# Patient Record
Sex: Female | Born: 1985 | Race: Black or African American | Marital: Single | Smoking: Never smoker
Health system: Southern US, Community
[De-identification: ages and names within clinical notes are randomized; demographics above are authoritative.]

## PROBLEM LIST (undated history)

## (undated) DIAGNOSIS — I10 Essential (primary) hypertension: Secondary | ICD-10-CM

## (undated) DIAGNOSIS — N63 Unspecified lump in unspecified breast: Secondary | ICD-10-CM

## (undated) DIAGNOSIS — D649 Anemia, unspecified: Secondary | ICD-10-CM

## (undated) DIAGNOSIS — Z8619 Personal history of other infectious and parasitic diseases: Secondary | ICD-10-CM

## (undated) HISTORY — DX: Personal history of other infectious and parasitic diseases: Z86.19

## (undated) HISTORY — PX: TONSILLECTOMY: SUR1361

## (undated) HISTORY — DX: Unspecified lump in unspecified breast: N63.0

## (undated) HISTORY — DX: Essential (primary) hypertension: I10

## (undated) HISTORY — DX: Anemia, unspecified: D64.9

---

## 2014-04-11 ENCOUNTER — Encounter: Payer: Self-pay | Admitting: Family Medicine

## 2014-04-11 ENCOUNTER — Ambulatory Visit (HOSPITAL_BASED_OUTPATIENT_CLINIC_OR_DEPARTMENT_OTHER)
Admission: RE | Admit: 2014-04-11 | Discharge: 2014-04-11 | Disposition: A | Discharge status: Home / Self Care | Payer: Managed Care, Other (non HMO) | Source: Ambulatory Visit | Attending: Family Medicine | Admitting: Family Medicine

## 2014-04-11 ENCOUNTER — Encounter (INDEPENDENT_AMBULATORY_CARE_PROVIDER_SITE_OTHER): Payer: Self-pay

## 2014-04-11 ENCOUNTER — Ambulatory Visit (INDEPENDENT_AMBULATORY_CARE_PROVIDER_SITE_OTHER): Payer: Managed Care, Other (non HMO) | Admitting: Family Medicine

## 2014-04-11 VITALS — BP 140/96 | HR 88 | Ht 66.0 in | Wt 130.0 lb

## 2014-04-11 DIAGNOSIS — M542 Cervicalgia: Secondary | ICD-10-CM

## 2014-04-11 DIAGNOSIS — M5010 Cervical disc disorder with radiculopathy, unspecified cervical region: Secondary | ICD-10-CM

## 2014-04-11 DIAGNOSIS — M5412 Radiculopathy, cervical region: Secondary | ICD-10-CM

## 2014-04-11 MED ORDER — PREDNISONE (PAK) 10 MG PO TABS: ORAL_TABLET | ORAL | Status: AC

## 2014-04-11 NOTE — Patient Instructions (Signed)
Get x-rays before you leave today. You have cervical radiculopathy (a pinched nerve in the neck). Take prednisone 6 day dose pack to relieve irritation/inflammation of the nerve. You can take ibuprofen or aleve the day after you finish the prednisone. Consider cervical collar if severely painful. Simple range of motion exercises within limits of pain to prevent further stiffness. Start physical therapy for stretching, exercises, traction, and modalities. Heat 15 minutes at a time 3-4 times a day to help with spasms. Watch head position when on computers, texting, when sleeping in bed - should in line with back to prevent further nerve traction and irritation. Consider home traction unit if you get benefit with this in physical therapy. If not improving we will consider an MRI. Follow up with me in 5-6 weeks.

## 2014-04-17 ENCOUNTER — Encounter: Payer: Self-pay | Admitting: Family Medicine

## 2014-04-17 DIAGNOSIS — M542 Cervicalgia: Secondary | ICD-10-CM | POA: Insufficient documentation

## 2014-04-17 NOTE — Progress Notes (Signed)
Patient ID: Paula Mendez, female   DOB: 1986/03/01, 28 y.o.   MRN: [id number]  PCP: No primary provider on file.  Subjective:   HPI: Patient is a 28 y.o. female here for back pain.  Patient reports she's had back pain for about 12 years. 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 Was very competitive, did tumbling also. Developed pain in neck area to between scapulae more on right side than left. Went to a chiropractor 9 years ago, had x-rays too - only mild benefit. Pain has worsened recently - can't sleep at night. Tried ibuprofen, physical therapy for a few visits and a TENS unit, icing. Pain can go with numbness/tingling into right fingertips. No bowel/bladder dysfunction.  History reviewed. No pertinent past medical history.  No current outpatient prescriptions on file prior to visit.   No current facility-administered medications on file prior to visit.    Past Surgical History  Procedure Laterality Date  . Tonsillectomy      Allergies  Allergen Reactions  . Amoxicillin   . Penicillins   . Septra [Sulfamethoxazole-Trimethoprim]   . Sulfa Antibiotics     History   Social History  . Marital Status: Single    Spouse Name: N/A    Number of Children: N/A  . Years of Education: N/A   Occupational History  . Not on file.   Social History Main Topics  . Smoking status: Never Smoker   . Smokeless tobacco: Not on file  . Alcohol Use: Not on file  . Drug Use: Not on file  . Sexual Activity: Not on file   Other Topics Concern  . Not on file   Social History Narrative  . No narrative on file    No family history on file.  BP 140/96  Pulse 88  Ht 5\' 6"  (1.676 m)  Wt 130 lb (58.968 kg)  BMI 20.99 kg/m2  LMP 03/28/2014  Review of Systems: See HPI above.    Objective:  Physical Exam:  Gen: NAD  Neck: No gross deformity, swelling, bruising. TTP right cervical paraspinal region.  No midline/bony TTP. FROM neck - pain with right and  left lateral rotations. BUE strength 5/5.   Sensation intact to light touch currently. 2+ equal reflexes in triceps, biceps, brachioradialis tendons. Negative spurlings. NV intact distal BUEs.    Assessment & Plan:  1. Neck pain - concerning for cervical radiculopathy, likely discogenic.  Radiographs normal without evidence arthritis.  Will start with prednisone dose pack then transition to nsaids.  ROM exercises, more extensive therapy (only did a few visits).  Ergonomic issues discussed.  Heat as needed.  F/u in 5-6 weeks.  Consider MRI if not improving.

## 2014-04-17 NOTE — Assessment & Plan Note (Signed)
concerning for cervical radiculopathy, likely discogenic.  Radiographs normal without evidence arthritis.  Will start with prednisone dose pack then transition to nsaids.  ROM exercises, more extensive therapy (only did a few visits).  Ergonomic issues discussed.  Heat as needed.  F/u in 5-6 weeks.  Consider MRI if not improving.

## 2015-11-22 IMAGING — CR DG CERVICAL SPINE COMPLETE 4+V
5 series · 5 of 5 positions shown · non-contrast
Comparison: None.

CLINICAL DATA: Chronic neck pain

EXAM:
CERVICAL SPINE  4+ VIEWS

[w c-spine lat]
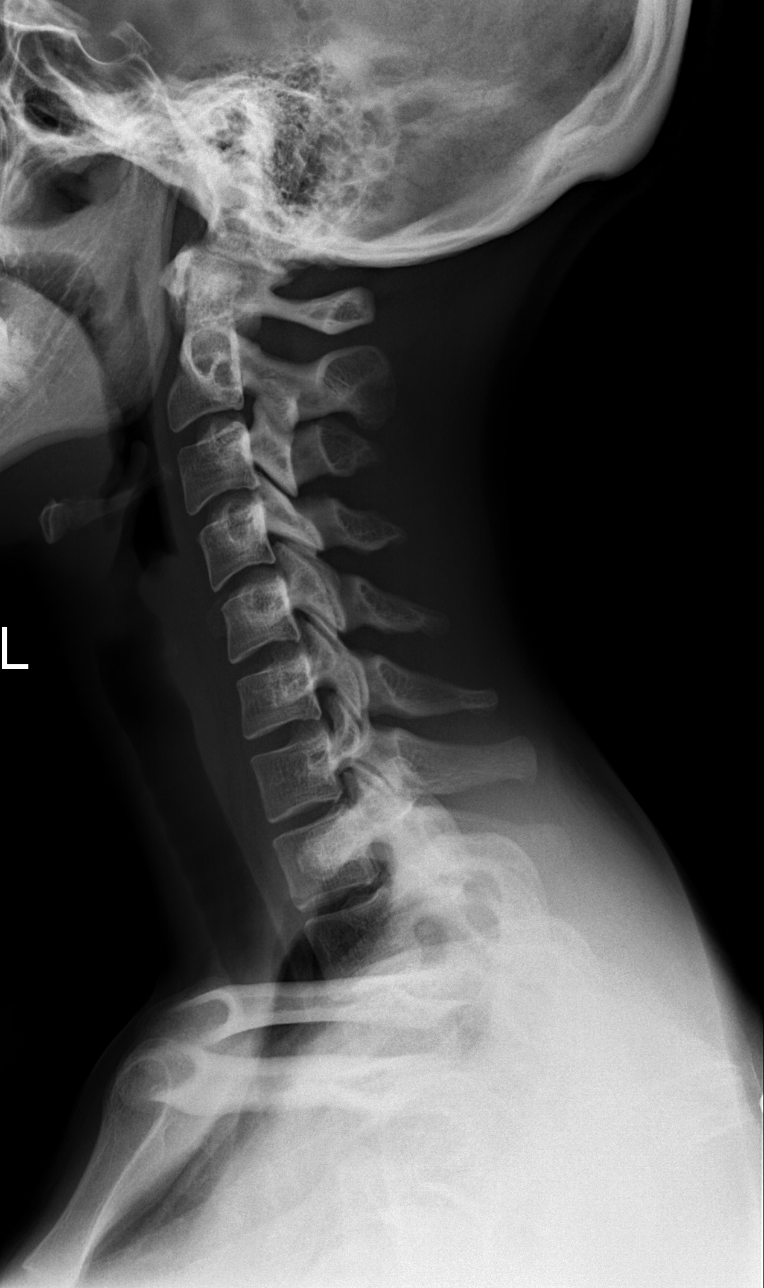

[w c-spine oblique (1 of 2)]
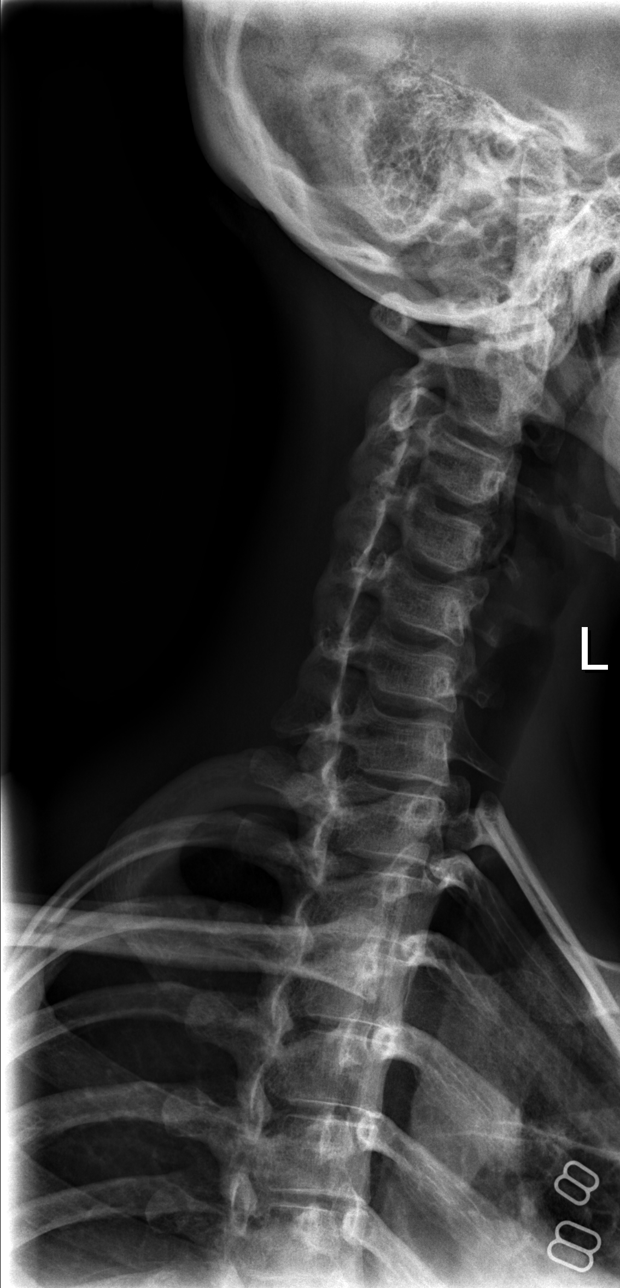

[w c-spine oblique (2 of 2)]
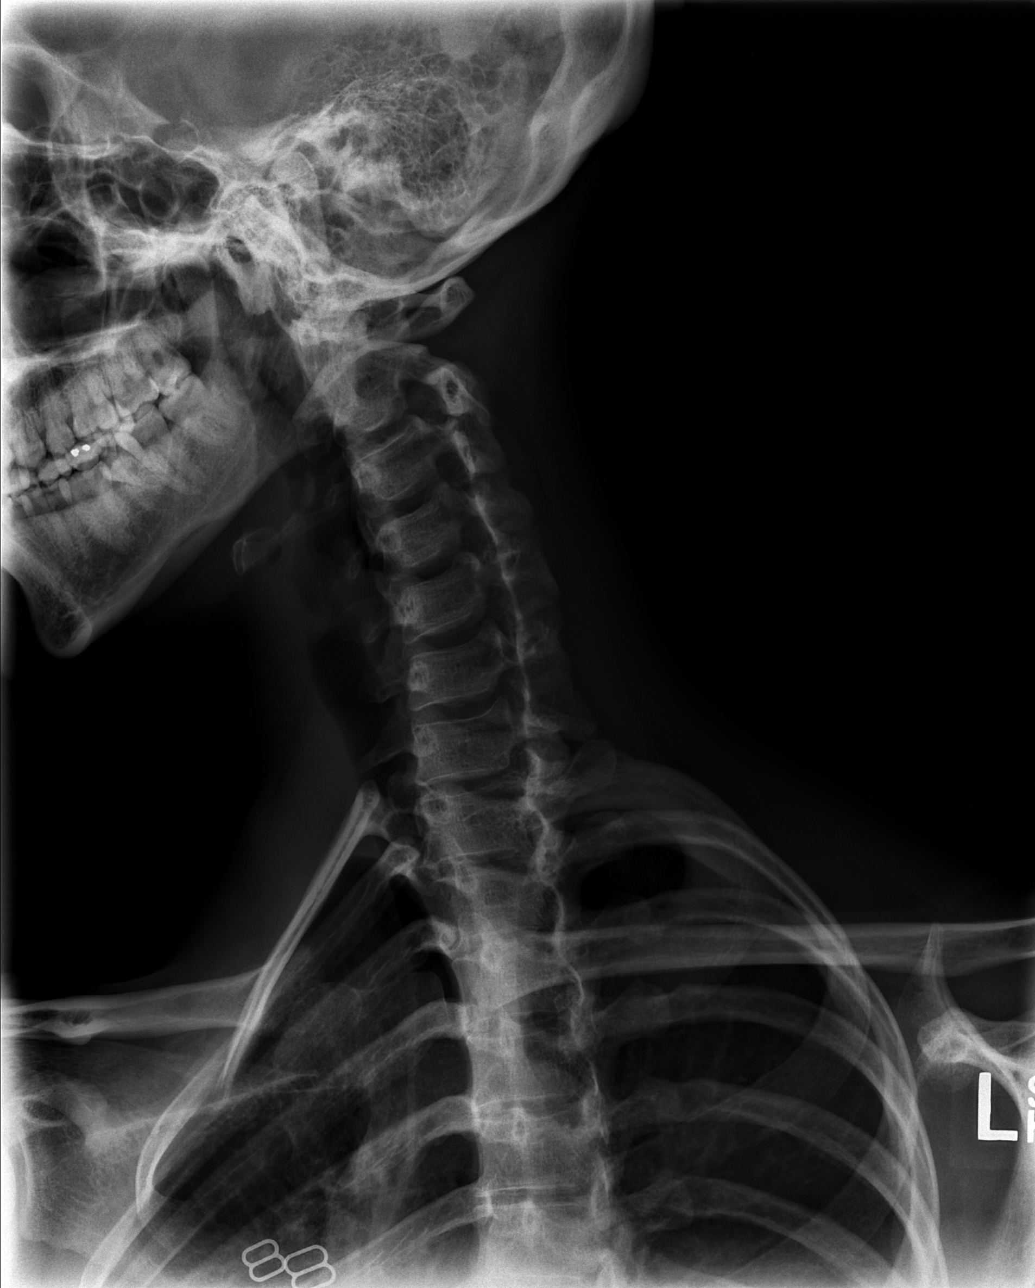

[w c-spine a.p.]
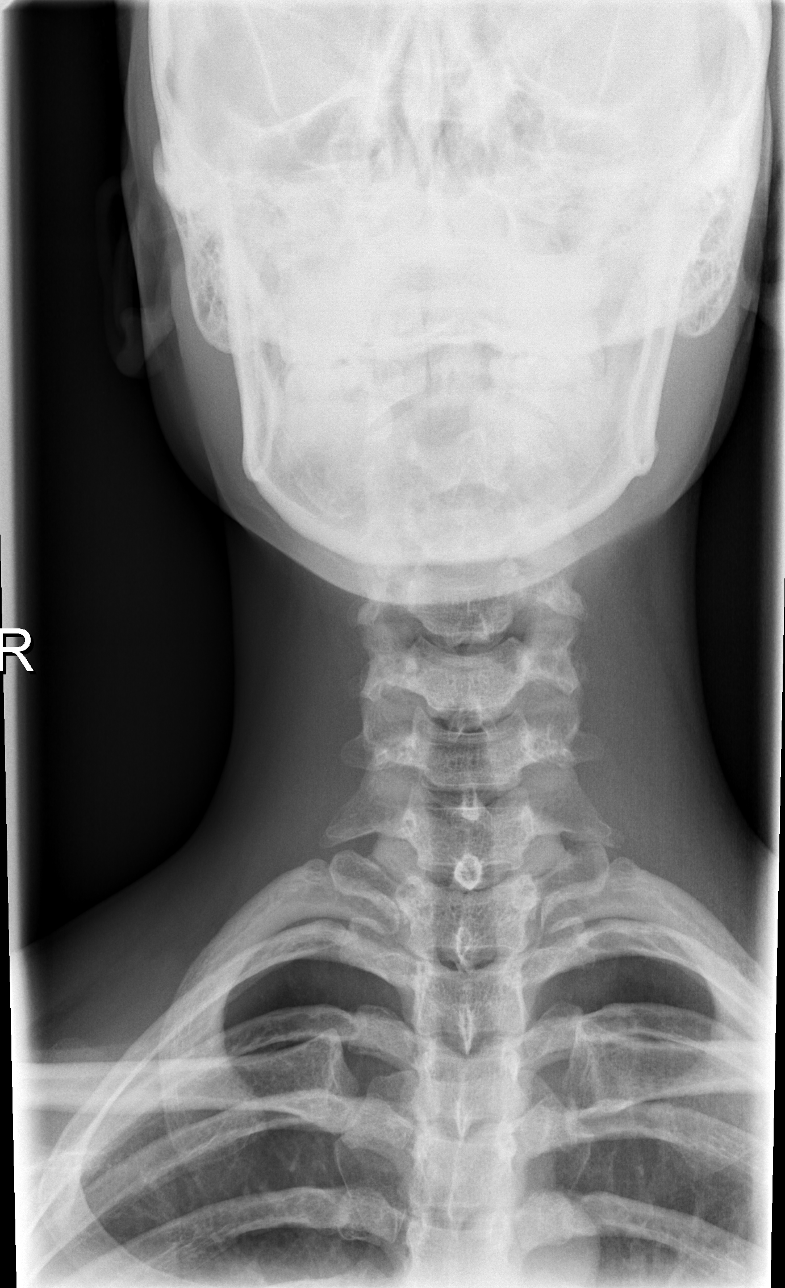

[w c-spine odontoid]
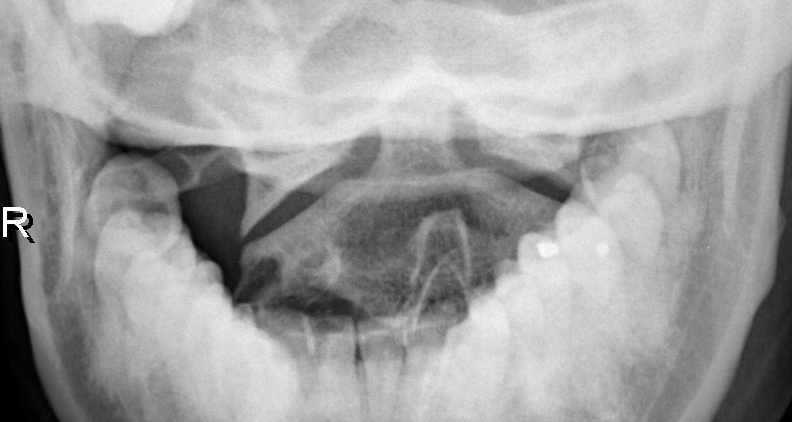

[5 of 5 positions shown; findings below may reference images not displayed]

FINDINGS: Seven cervical segments are well visualized. Vertebral body height
is well maintained. The neural foramina are widely patent
bilaterally. The odontoid is within normal limits. No gross soft
tissue abnormality is seen.
IMPRESSION: No acute abnormality noted.

## 2016-07-19 ENCOUNTER — Encounter (INDEPENDENT_AMBULATORY_CARE_PROVIDER_SITE_OTHER): Payer: BC Managed Care – PPO | Admitting: Obstetrics and Gynecology

## 2016-07-28 ENCOUNTER — Encounter (INDEPENDENT_AMBULATORY_CARE_PROVIDER_SITE_OTHER): Payer: BC Managed Care – PPO | Admitting: Obstetrics and Gynecology

## 2016-08-04 ENCOUNTER — Encounter (INDEPENDENT_AMBULATORY_CARE_PROVIDER_SITE_OTHER): Payer: Self-pay | Admitting: Obstetrics and Gynecology

## 2016-08-04 ENCOUNTER — Ambulatory Visit (INDEPENDENT_AMBULATORY_CARE_PROVIDER_SITE_OTHER): Payer: BC Managed Care – PPO | Admitting: Obstetrics and Gynecology

## 2016-08-04 VITALS — BP 153/92 | HR 90 | Wt 126.0 lb

## 2016-08-04 DIAGNOSIS — Z124 Encounter for screening for malignant neoplasm of cervix: Secondary | ICD-10-CM

## 2016-08-04 DIAGNOSIS — Z113 Encounter for screening for infections with a predominantly sexual mode of transmission: Secondary | ICD-10-CM

## 2016-08-04 DIAGNOSIS — Z01419 Encounter for gynecological examination (general) (routine) without abnormal findings: Secondary | ICD-10-CM

## 2016-08-04 DIAGNOSIS — R030 Elevated blood-pressure reading, without diagnosis of hypertension: Secondary | ICD-10-CM

## 2016-08-04 DIAGNOSIS — Z3009 Encounter for other general counseling and advice on contraception: Secondary | ICD-10-CM

## 2016-08-04 MED ORDER — MISOPROSTOL 100 MCG PO TABS
ORAL_TABLET | ORAL | 0 refills | Status: DC
Start: 2016-08-04 — End: 2018-06-15

## 2016-08-04 NOTE — Progress Notes (Signed)
Subjective:      Alexandra Schroeder is a 30 y.o. female who presents for an annual exam. The patient has no complaints today. The patient is sexually active. GYN screening history: last pap: was normal. The patient wears seatbelts: yes The patient participates in regular exercise: yes. Has the patient ever been transfused or tattooed?: no. The patient reports that there is not domestic violence in her life.     Menstrual History:  OB History     Gravida Para Term Preterm AB Living    2       2      SAB TAB Ectopic Multiple Live Births                      Menarche age: 54  Patient's last menstrual period was 07/28/2016.       The following portions of the patient's history were reviewed and updated as appropriate: allergies, current medications, past family history, past medical history, past social history, past surgical history and problem list.    Review of Systems  Pertinent items are noted in HPI.   Review of Systems   Constitutional: Negative for fever.   HENT: Negative for hearing loss.    Eyes: Negative for blurred vision.   Respiratory: Negative for cough.    Cardiovascular: Negative for chest pain.   Gastrointestinal: Negative for heartburn.   Genitourinary: Negative for dysuria.   Musculoskeletal: Negative for myalgias.   Skin: Negative for rash.   Neurological: Negative for dizziness.   Endo/Heme/Allergies: Bruises/bleeds easily.   Psychiatric/Behavioral: Negative for depression.        Objective:     General Appearance:    Alert, cooperative, no distress, appears stated age   Head:    Normocephalic, without obvious abnormality, atraumatic       Ears:    Normal  ear canals, both ears   Nose:   Nares normal, septum midline, mucosa normal, no drainage    or sinus tenderness   Throat:   Lips, mucosa, and tongue normal; teeth and gums normal   Neck:   Supple, symmetrical, trachea midline, no adenopathy;     thyroid:  no enlargement/tenderness/nodules   Back:     Symmetric, no curvature, ROM normal, no CVA  tenderness   Lungs:     Clear to auscultation bilaterally, respirations unlabored   Chest Wall:    No tenderness or deformity    Heart:    Regular rate and rhythm, S1 and S2 normal, no murmur, rub   or gallop   Breast Exam:    No tenderness, masses, or nipple abnormality   Abdomen:     Soft, non-tender, bowel sounds active all four quadrants,     no masses, no organomegaly   Genitalia:    Normal female without lesion, discharge or tenderness       Extremities:   Extremities normal, atraumatic, no cyanosis or edema       Skin:   Skin color, texture, turgor normal, no rashes or lesions   Lymph nodes:   Cervical, supraclavicular, and axillary nodes normal   Neurologic:   CNII-XII intact, normal strength, sensation and reflexes     throughout      Assessment:      Healthy female exam.      Plan:    BC counseling done and will get IUD  Cytotec prescription given    All questions answered.  Await pap smear results.

## 2016-08-05 ENCOUNTER — Ambulatory Visit (INDEPENDENT_AMBULATORY_CARE_PROVIDER_SITE_OTHER): Payer: BC Managed Care – PPO | Admitting: Internal Medicine

## 2016-08-05 ENCOUNTER — Encounter (INDEPENDENT_AMBULATORY_CARE_PROVIDER_SITE_OTHER): Payer: Self-pay | Admitting: Internal Medicine

## 2016-08-05 VITALS — BP 150/98 | HR 80 | Temp 97.8°F | Resp 12 | Ht 65.5 in | Wt 126.0 lb

## 2016-08-05 DIAGNOSIS — F17200 Nicotine dependence, unspecified, uncomplicated: Secondary | ICD-10-CM

## 2016-08-05 DIAGNOSIS — I10 Essential (primary) hypertension: Secondary | ICD-10-CM

## 2016-08-05 LAB — RPR (REFLEX TO TITER AND CONFIRMATION): RPR: NONREACTIVE

## 2016-08-05 LAB — HIV AG/AB 4TH GENERATION: HIV Ag/Ab, 4th Generation: NONREACTIVE

## 2016-08-05 LAB — MICROALBUMIN, RANDOM URINE
Urine Creatinine, Random: 131.4 mg/dL
Urine Microalbumin, Random: 8 (ref 0.0–30.0)
Urine Microalbumin/Creatinine Ratio: 6 ug/mg (ref 0–30)

## 2016-08-05 LAB — HEPATITIS C ANTIBODY: Hepatitis C, AB: NONREACTIVE

## 2016-08-05 LAB — HEMOLYSIS INDEX: Hemolysis Index: 11 (ref 0–18)

## 2016-08-05 LAB — HEPATITIS B SURFACE ANTIGEN W/ REFLEX TO CONFIRMATION: Hepatitis B Surface Antigen: NONREACTIVE

## 2016-08-05 MED ORDER — NICOTINE 21 MG/24HR TD PT24
1.0000 | MEDICATED_PATCH | Freq: Every day | TRANSDERMAL | 1 refills | Status: DC
Start: 2016-08-05 — End: 2018-06-15

## 2016-08-05 MED ORDER — NICOTINE 21 MG/24HR TD PT24
1.0000 | MEDICATED_PATCH | Freq: Every day | TRANSDERMAL | 1 refills | Status: DC
Start: 2016-08-05 — End: 2016-08-05

## 2016-08-05 MED ORDER — BLOOD PRESSURE CUFF MISC
0 refills | Status: DC
Start: 2016-08-05 — End: 2021-03-19

## 2016-08-05 MED ORDER — LISINOPRIL-HYDROCHLOROTHIAZIDE 10-12.5 MG PO TABS
1.0000 | ORAL_TABLET | Freq: Every day | ORAL | 0 refills | Status: DC
Start: 2016-08-05 — End: 2016-08-30

## 2016-08-05 NOTE — Progress Notes (Signed)
PROGRESS NOTE    Date Time: 08/05/2016 12:42 PM  Patient Name: Alexandra Schroeder    Chief Complaint   Patient presents with   . Hypertension       Subjective:   Patient is a 30 y.o. female with PMH as below is here New patient consultation.  The patient has a past medical history for hypertension.  The patient was seen by her OB/GYN doctor for intrauterine device implantation yesterday, her blood pressure was found to be elevated in the 150s systolic.  The patient was asymptomatic.  She was referred to [place] for further evaluation and treatment.  The patient denies any symptoms today including chest pain, palpitation, diaphoresis, shortness of breath, headache, blurry vision.  Past medical history of elevated blood pressure in the past, she was treated with a transient course of medication including amlodipine with clonidine combination, however, the patient was ALLERGIC to clonidine component, and the medication was discontinue and she was treated conservatively.  The patient is adopted, does not know her family history, she does not use a coffee, denies any excessive uses of salt, the patient is not obese, stress level is normal.  No increased stress over the past 3-6 months.    HISTORY:  Past Medical History:   Diagnosis Date   . Anemia    . History of chlamydia     age 26   . Hypertension      Past Surgical History:   Procedure Laterality Date   . TONSILLECTOMY       Family History   Problem Relation Age of Onset   . Adopted: Yes   . Family history unknown: Yes     Social History     Social History   . Marital status: Unknown     Spouse name: N/A   . Number of children: N/A   . Years of education: N/A     Social History Main Topics   . Smoking status: Current Every Day Smoker     Packs/day: 1.00   . Smokeless tobacco: Never Used   . Alcohol use 9.0 oz/week     15 Glasses of wine per week   . Drug use: No   . Sexual activity: Yes     Partners: Male     Birth control/ protection: OCP     Other Topics Concern   .  None     Social History Narrative   . None       There is no immunization history on file for this patient.  History reviewed  Review of Systems:   Pertinent review of systems as in HPI.  All other systems reviewed and negative.    Physical Exam:   BP (!) 150/98 (BP Site: Right arm, Patient Position: Sitting, Cuff Size: Medium)   Pulse 80   Temp 97.8 F (36.6 C) (Oral)   Resp 12   Ht 1.664 m (5' 5.5")   Wt 57.2 kg (126 lb)   LMP 07/28/2016   SpO2 99%   BMI 20.65 kg/m  Body mass index is 20.65 kg/m.    General appearance - alert and appropriate, comfortable   HEENT - pupils equal and reactive, normal conjunctiva  CV - normal S1, S2, no murmurs  Resp - normal respiratory effort, clear breath sound bilaterally   Abdomen - soft, non tender palpation, no splenomegaly   Musculoskeletal: normal gait, extremities with no clubbing  Skin - warm, dry, no rashes   PSYCH: normal mood and affect  Neuro -  alert and oriented x3    Assessment/Plan:     1. Hypertension, unspecified type  Basic Metabolic Panel    CBC without differential    TSH    T4, free    Cortisol    Microalbumin, Random Urine    VMA, Urine    US Renal Artery Duplex Doppler Complete  -Blood pressure was consistently high today, similar to result from yesterday during her visit with her OB/GYN.  We will start the patient on Hydrochlorothiazide/lisinopril.  Potential side effects discussed with the patient.  Also discussed evaluation for secondary hypertension.  The patient had blood work done in the past, however, she would like to request to resume evaluation again.  We will check labs as above, if normal, renal ultrasound.     2. Smoking  --Discussed smoking cessation with the patient at length.  Different treatment options with the patient was discussed.  The patient would like to avoid oral medication.  We will start the patient on transdermal NicoDerm patch.         This note was dictated using voice recognition speech to text software in may  contain inadvertent recognition errors.   Risks & benefits of the new medication(s) were explained to the patient, who appeared to understand and agrees to the treatment plan.    Signed by: Lynelle Doctor, MD  Internal Medicine

## 2016-08-05 NOTE — Progress Notes (Signed)
Have you seen any new specialists/physicians since you were last here? No          Limb alert protocol reviewed?  Yes

## 2016-08-06 LAB — BASIC METABOLIC PANEL
BUN: 14 mg/dL (ref 7.0–19.0)
CO2: 24 mEq/L (ref 21–30)
Calcium: 9.9 mg/dL (ref 8.5–10.5)
Chloride: 104 mEq/L (ref 100–111)
Creatinine: 0.8 mg/dL (ref 0.4–1.5)
Glucose: 81 mg/dL (ref 70–100)
Potassium: 4.3 mEq/L (ref 3.5–5.1)
Sodium: 137 mEq/L (ref 135–146)

## 2016-08-06 LAB — CBC
Absolute NRBC: 0 10*3/uL
Hematocrit: 36.3 % — ABNORMAL LOW (ref 37.0–47.0)
Hgb: 11.9 g/dL — ABNORMAL LOW (ref 12.0–16.0)
MCH: 34.3 pg — ABNORMAL HIGH (ref 28.0–32.0)
MCHC: 32.8 g/dL (ref 32.0–36.0)
MCV: 104.6 fL — ABNORMAL HIGH (ref 80.0–100.0)
MPV: 10 fL (ref 9.4–12.3)
Nucleated RBC: 0 /100 WBC (ref 0.0–1.0)
Platelets: 291 10*3/uL (ref 140–400)
RBC: 3.47 10*6/uL — ABNORMAL LOW (ref 4.20–5.40)
RDW: 15 % (ref 12–15)
WBC: 6.23 10*3/uL (ref 3.50–10.80)

## 2016-08-06 LAB — TSH: TSH: 0.64 u[IU]/mL (ref 0.35–4.94)

## 2016-08-06 LAB — C. TRACHOMATIS/N. GONORRHOEAE RNA, TMA
Chlamydia trachomatis RNA, TMA: NOT DETECTED
N. gonorrhoeae RNA, TMA: NOT DETECTED

## 2016-08-06 LAB — T4, FREE: T4 Free: 0.85 ng/dL (ref 0.70–1.48)

## 2016-08-06 LAB — CORTISOL: Cortisol: 15.7 ug/dL

## 2016-08-06 LAB — HEMOLYSIS INDEX: Hemolysis Index: 8 (ref 0–18)

## 2016-08-06 LAB — GFR: EGFR: >60

## 2016-08-07 LAB — URINE VMA RANDOM
Creatinine, UR: 139.4 mg/dL (ref 20–320)
VMA/g Creatinine: 2.2 (ref 1.1–4.1)

## 2016-08-11 LAB — PAP SMEAR, THIN PREP W/ REFLEX TO HR HPV

## 2016-08-12 ENCOUNTER — Encounter (INDEPENDENT_AMBULATORY_CARE_PROVIDER_SITE_OTHER): Payer: Self-pay | Admitting: Obstetrics and Gynecology

## 2016-08-30 ENCOUNTER — Encounter (INDEPENDENT_AMBULATORY_CARE_PROVIDER_SITE_OTHER): Payer: Self-pay | Admitting: Obstetrics and Gynecology

## 2016-08-30 ENCOUNTER — Ambulatory Visit (INDEPENDENT_AMBULATORY_CARE_PROVIDER_SITE_OTHER): Payer: BC Managed Care – PPO | Admitting: Obstetrics and Gynecology

## 2016-08-30 VITALS — BP 113/81 | HR 111 | Ht 65.0 in | Wt 122.0 lb

## 2016-08-30 DIAGNOSIS — Z30430 Encounter for insertion of intrauterine contraceptive device: Secondary | ICD-10-CM

## 2016-08-30 MED ORDER — LEVONORGESTREL 13.5 MG IU IUD
1.0000 | INTRAUTERINE_SYSTEM | Freq: Once | INTRAUTERINE | Status: AC
Start: 2016-08-30 — End: 2016-08-30
  Administered 2016-08-30: 1 via INTRAUTERINE

## 2016-08-30 NOTE — Procedures (Signed)
,  UNIVERSAL PROTOCOL:  TIMEOUT    Procedure: Alexandra Schroeder Date: 08/30/16   All correct equipment/supplies are present  And ready for use prior to the procedure YES   Patient stated name and date of birth YES   Patient verbally stated the procedure (including the site and side) to be completed. YES   Informed Consent reviewed and signed consistent with procedure, side, and site information YES   Practitioner (MD/DO/DPM/NP/PA/RN) performing the procedure marked the site as indicated YES   Asked the patient for any known drug allergies, including anesthetics and latex. YES   Labels for specimens are prepared with the following information:  Date specimen collected  Name of patient  MR#  Provider name  Specimen type  and placed on containers in presence of patient YES   Verified that patient has had all questions answered. YES   Method of notification of biopsy results requested by patient N/A

## 2016-08-30 NOTE — Progress Notes (Signed)
IUD Insertion Procedure Note    Pre-operative Diagnosis: desires LARC    Post-operative Diagnosis: same    Indications: contraception    Procedure Details   Urine pregnancy test was done today and result was negative.  The risks (including infection, bleeding, pain, and uterine perforation) and benefits of the procedure were explained to the patient and Written informed consent was obtained.      Cervix cleansed with Betadine. Uterus sounded to 6 cm. IUD inserted without difficulty. String visible and trimmed. Patient tolerated procedure well.    IUD Information:  Skyla.    Condition:  Stable    Complications:  None    Plan:    The patient was advised to call for any fever or for prolonged or severe pain or bleeding. She was advised to use NSAID as needed for mild to moderate pain.     Attending Physician Documentation:  I was present for or participated in the entire procedure, including opening and closing.

## 2016-11-01 ENCOUNTER — Other Ambulatory Visit (INDEPENDENT_AMBULATORY_CARE_PROVIDER_SITE_OTHER): Payer: Self-pay | Admitting: Internal Medicine

## 2017-04-04 ENCOUNTER — Other Ambulatory Visit (INDEPENDENT_AMBULATORY_CARE_PROVIDER_SITE_OTHER): Payer: Self-pay | Admitting: Internal Medicine

## 2017-05-20 ENCOUNTER — Other Ambulatory Visit (INDEPENDENT_AMBULATORY_CARE_PROVIDER_SITE_OTHER): Payer: Self-pay | Admitting: Internal Medicine

## 2017-05-20 NOTE — Telephone Encounter (Signed)
Hi front desk team,     Please schedule the pt for a follow up visit med refill and further management of Hypertension.      Thanks,  Luanne Bras

## 2017-05-23 ENCOUNTER — Telehealth (INDEPENDENT_AMBULATORY_CARE_PROVIDER_SITE_OTHER): Payer: Self-pay | Admitting: Internal Medicine

## 2017-05-23 ENCOUNTER — Encounter (INDEPENDENT_AMBULATORY_CARE_PROVIDER_SITE_OTHER): Payer: Self-pay

## 2017-05-23 NOTE — Telephone Encounter (Signed)
Contacted the patient to schedule a follow up appt for refill and further management of Hypertension. I was unable to leave a voicemail.

## 2017-05-23 NOTE — Telephone Encounter (Signed)
Contacted the patient to schedule a follow up appt for refill and further management of Hypertension. I was unable to leave a voicemail.

## 2017-05-25 NOTE — Telephone Encounter (Signed)
Thank you :)

## 2018-06-02 ENCOUNTER — Encounter (INDEPENDENT_AMBULATORY_CARE_PROVIDER_SITE_OTHER): Payer: Self-pay | Admitting: Obstetrics and Gynecology

## 2018-06-02 ENCOUNTER — Ambulatory Visit (INDEPENDENT_AMBULATORY_CARE_PROVIDER_SITE_OTHER): Payer: No Typology Code available for payment source | Admitting: Obstetrics and Gynecology

## 2018-06-02 VITALS — BP 134/89 | HR 93 | Wt 126.4 lb

## 2018-06-02 DIAGNOSIS — Z30432 Encounter for removal of intrauterine contraceptive device: Secondary | ICD-10-CM

## 2018-06-02 NOTE — Progress Notes (Signed)
Doing well   IUD in place   No cramping, no bleeding   Some brown spotting noted  No pain with sex and is content with it.  PE:   GA NAD   Pelvic exam:   Normal appearing cervix  Brown spotting noted   Normal appearing urethra and vulva and vagina   No cervical motion tenderness noted   No uterine tenderness noted   No adnexal masses were felt   IUD strings seen   A/P:    32  yo presenting for IUD removal  and is doing well   Advised that the spotting could occur after removal   Counseled and consented for the removal    Prepped in the usual manner   IUD strings seen   IUD strings grasped with ring forceps and removed with no complication   Discussed with patient that she might get pregnant after removal of IUD  All questions were answered

## 2018-06-08 ENCOUNTER — Ambulatory Visit (INDEPENDENT_AMBULATORY_CARE_PROVIDER_SITE_OTHER): Payer: No Typology Code available for payment source | Admitting: Obstetrics and Gynecology

## 2018-06-08 ENCOUNTER — Encounter (INDEPENDENT_AMBULATORY_CARE_PROVIDER_SITE_OTHER): Payer: Self-pay | Admitting: Obstetrics and Gynecology

## 2018-06-08 VITALS — BP 131/95 | HR 102

## 2018-06-08 DIAGNOSIS — Z1239 Encounter for other screening for malignant neoplasm of breast: Secondary | ICD-10-CM

## 2018-06-08 DIAGNOSIS — Z01419 Encounter for gynecological examination (general) (routine) without abnormal findings: Secondary | ICD-10-CM

## 2018-06-08 DIAGNOSIS — Z113 Encounter for screening for infections with a predominantly sexual mode of transmission: Secondary | ICD-10-CM

## 2018-06-08 DIAGNOSIS — N6002 Solitary cyst of left breast: Secondary | ICD-10-CM

## 2018-06-08 DIAGNOSIS — Z124 Encounter for screening for malignant neoplasm of cervix: Secondary | ICD-10-CM

## 2018-06-08 DIAGNOSIS — Z1231 Encounter for screening mammogram for malignant neoplasm of breast: Secondary | ICD-10-CM

## 2018-06-08 NOTE — Progress Notes (Signed)
Subjective:      Alexandra Schroeder is a 32 y.o. female who presents for an annual exam. The patient has no complaints today. The patient is sexually active. GYN screening history: last pap: was normal. The patient wears seatbelts: yes. The patient participates in regular exercise: yes. Has the patient ever been transfused or tattooed?: no. The patient reports that there is not domestic violence in her life.     Menstrual History:  OB History     Gravida Para Term Preterm AB Living    2       2      SAB TAB Ectopic Multiple Live Births                      Menarche age: 73  Patient's last menstrual period was 05/31/2018.       The following portions of the patient's history were reviewed and updated as appropriate: allergies, current medications, past family history, past medical history, past social history, past surgical history and problem list.    Review of Systems  Pertinent items are noted in HPI.   Review of Systems   Constitutional: Negative for fever.   HENT: Negative for hearing loss.    Eyes: Negative for blurred vision and double vision.   Respiratory: Negative for cough.    Cardiovascular: Negative for chest pain.   Gastrointestinal: Negative for heartburn, nausea and vomiting.   Genitourinary: Negative for dysuria.   Musculoskeletal: Negative for myalgias.   Skin: Negative for rash.   Neurological: Negative for dizziness and headaches.   Endo/Heme/Allergies: Negative for environmental allergies.   Psychiatric/Behavioral: Negative for depression.        Objective:     General Appearance:    Alert, cooperative, no distress, appears stated age   Head:    Normocephalic, without obvious abnormality, atraumatic       Ears:    Normal  ear canals, both ears   Nose:   Nares normal, septum midline, mucosa normal, no drainage    or sinus tenderness   Throat:   Lips, mucosa, and tongue normal; teeth and gums normal   Neck:   Supple, symmetrical, trachea midline, no adenopathy;     thyroid:  no  enlargement/tenderness/nodules   Back:     Symmetric, no curvature, ROM normal, no CVA tenderness   Lungs:     Clear to auscultation bilaterally, respirations unlabored   Chest Wall:    No tenderness or deformity    Heart:    Regular rate and rhythm, S1 and S2 normal, no murmur, rub   or gallop   Breast Exam:    No tenderness, masses, or nipple abnormality on the right, left breast has an enlarged cyst    Abdomen:     Soft, non-tender, bowel sounds active all four quadrants,     no masses, no organomegaly   Genitalia:  Normal appearing cervix  No vaginal discharge noted  Normal appearing urethra and vulva and vagina   No cervical motion tenderness noted   No uterine tenderness noted   No adnexal masses were felt          Extremities:   Extremities normal, atraumatic, no cyanosis or edema       Skin:   Skin color, texture, turgor normal, no rashes or lesions                Assessment:      Healthy female exam.    left  breast cyst   Plan:    STD screening today including swab   Diagnostic mammogram ordered   All questions answered.  Await pap smear results.

## 2018-06-09 LAB — SYPHILIS SCREEN IGG AND IGM: Syphilis Screen IgG and IgM: NONREACTIVE

## 2018-06-09 LAB — GENITAL CHLAMYDIA/NEISSERIA BY PCR
Chlamydia DNA by PCR: NEGATIVE
Neisseria gonorrhoeae by PCR: NEGATIVE

## 2018-06-09 LAB — HEPATITIS B SURFACE ANTIGEN W/ REFLEX TO CONFIRMATION: Hepatitis B Surface Antigen: NONREACTIVE

## 2018-06-09 LAB — HIV AG/AB 4TH GENERATION: HIV Ag/Ab, 4th Generation: NONREACTIVE

## 2018-06-09 LAB — HEMOLYSIS INDEX: Hemolysis Index: 3 (ref 0–18)

## 2018-06-09 LAB — HEPATITIS C ANTIBODY: Hepatitis C, AB: NONREACTIVE

## 2018-06-15 ENCOUNTER — Encounter (INDEPENDENT_AMBULATORY_CARE_PROVIDER_SITE_OTHER): Payer: Self-pay | Admitting: Obstetrics and Gynecology

## 2018-06-15 ENCOUNTER — Ambulatory Visit (INDEPENDENT_AMBULATORY_CARE_PROVIDER_SITE_OTHER): Payer: No Typology Code available for payment source | Admitting: Obstetrics and Gynecology

## 2018-06-15 VITALS — BP 131/88 | HR 86 | Wt 123.0 lb

## 2018-06-15 DIAGNOSIS — Z30017 Encounter for initial prescription of implantable subdermal contraceptive: Secondary | ICD-10-CM

## 2018-06-15 LAB — POCT PREGNANCY TEST, URINE HCG: POCT Pregnancy HCG Test, UR: NEGATIVE

## 2018-06-15 LAB — PAP SMEAR, THIN PREP WITH HR HPV: HPV DNA, high risk: DETECTED — AB

## 2018-06-15 MED ORDER — ETONOGESTREL 68 MG SC IMPL
1.00 | DRUG_IMPLANT | Freq: Once | SUBCUTANEOUS | Status: AC
Start: 2018-06-15 — End: 2018-10-09
  Administered 2018-10-09: 11:00:00 68 mg via SUBCUTANEOUS

## 2018-06-15 NOTE — Progress Notes (Signed)
Patient presenting for Nexplanon insertion   Counseled and consented for the above procedure and explained the risk and benefits  Risks being bleeding, infection, abnormal bleeding, muscle injury, nerve injury, bruising and pregnancy   Patient was prepped and draped in the normal fashion   Local anesthesia was given at the insertion site which was chosen to be 8-10 cm from medial epicondyle    Nexplanon inserted in the left arm with no complication.   Implant felt under skin at end of procedure   Arm was wrapped in elastic band   All questions were answered

## 2018-06-22 NOTE — Progress Notes (Signed)
Sent message to front desk staff to make appt

## 2018-11-20 ENCOUNTER — Other Ambulatory Visit (INDEPENDENT_AMBULATORY_CARE_PROVIDER_SITE_OTHER): Payer: Self-pay | Admitting: Obstetrics and Gynecology

## 2018-11-29 ENCOUNTER — Other Ambulatory Visit (INDEPENDENT_AMBULATORY_CARE_PROVIDER_SITE_OTHER): Payer: Self-pay

## 2018-11-29 ENCOUNTER — Other Ambulatory Visit (INDEPENDENT_AMBULATORY_CARE_PROVIDER_SITE_OTHER): Payer: No Typology Code available for payment source

## 2018-12-11 ENCOUNTER — Ambulatory Visit: Admission: RE | Admit: 2018-12-11 | Payer: No Typology Code available for payment source | Source: Ambulatory Visit

## 2018-12-21 ENCOUNTER — Other Ambulatory Visit (INDEPENDENT_AMBULATORY_CARE_PROVIDER_SITE_OTHER): Payer: Self-pay | Admitting: Obstetrics and Gynecology

## 2018-12-21 DIAGNOSIS — N6002 Solitary cyst of left breast: Secondary | ICD-10-CM

## 2018-12-22 ENCOUNTER — Ambulatory Visit: Admission: RE | Admit: 2018-12-22 | Payer: No Typology Code available for payment source | Source: Ambulatory Visit

## 2018-12-22 ENCOUNTER — Ambulatory Visit: Payer: No Typology Code available for payment source

## 2021-01-20 ENCOUNTER — Ambulatory Visit (INDEPENDENT_AMBULATORY_CARE_PROVIDER_SITE_OTHER): Payer: No Typology Code available for payment source | Admitting: Family Medicine

## 2021-02-12 ENCOUNTER — Encounter (INDEPENDENT_AMBULATORY_CARE_PROVIDER_SITE_OTHER): Payer: No Typology Code available for payment source | Admitting: Family Medicine

## 2021-02-23 ENCOUNTER — Encounter (INDEPENDENT_AMBULATORY_CARE_PROVIDER_SITE_OTHER): Payer: No Typology Code available for payment source | Admitting: Family Medicine

## 2021-03-01 DIAGNOSIS — U071 COVID-19: Secondary | ICD-10-CM

## 2021-03-01 HISTORY — DX: COVID-19: U07.1

## 2021-03-09 ENCOUNTER — Encounter (INDEPENDENT_AMBULATORY_CARE_PROVIDER_SITE_OTHER): Payer: No Typology Code available for payment source | Admitting: Family Medicine

## 2021-03-19 ENCOUNTER — Ambulatory Visit (INDEPENDENT_AMBULATORY_CARE_PROVIDER_SITE_OTHER): Payer: Self-pay | Admitting: Family Medicine

## 2021-03-19 ENCOUNTER — Encounter (INDEPENDENT_AMBULATORY_CARE_PROVIDER_SITE_OTHER): Payer: Self-pay | Admitting: Family Medicine

## 2021-03-19 VITALS — BP 136/86 | HR 109 | Temp 97.5°F | Ht 64.57 in | Wt 118.8 lb

## 2021-03-19 DIAGNOSIS — H00019 Hordeolum externum unspecified eye, unspecified eyelid: Secondary | ICD-10-CM

## 2021-03-19 DIAGNOSIS — Z23 Encounter for immunization: Secondary | ICD-10-CM

## 2021-03-19 DIAGNOSIS — N63 Unspecified lump in unspecified breast: Secondary | ICD-10-CM

## 2021-03-19 DIAGNOSIS — Z0000 Encounter for general adult medical examination without abnormal findings: Secondary | ICD-10-CM

## 2021-03-19 LAB — CBC AND DIFFERENTIAL
Absolute NRBC: 0 10*3/uL (ref 0.00–0.00)
Basophils Absolute Automated: 0.03 10*3/uL (ref 0.00–0.08)
Basophils Automated: 0.5 %
Eosinophils Absolute Automated: 0.04 10*3/uL (ref 0.00–0.44)
Eosinophils Automated: 0.6 %
Hematocrit: 40.4 % (ref 34.7–43.7)
Hgb: 13 g/dL (ref 11.4–14.8)
Immature Granulocytes Absolute: 0.03 10*3/uL (ref 0.00–0.07)
Immature Granulocytes: 0.5 %
Lymphocytes Absolute Automated: 1.73 10*3/uL (ref 0.42–3.22)
Lymphocytes Automated: 27.6 %
MCH: 35.3 pg — ABNORMAL HIGH (ref 25.1–33.5)
MCHC: 32.2 g/dL (ref 31.5–35.8)
MCV: 109.8 fL — ABNORMAL HIGH (ref 78.0–96.0)
MPV: 10.1 fL (ref 8.9–12.5)
Monocytes Absolute Automated: 0.75 10*3/uL (ref 0.21–0.85)
Monocytes: 12 %
Neutrophils Absolute: 3.69 10*3/uL (ref 1.10–6.33)
Neutrophils: 58.8 %
Nucleated RBC: 0 /100 WBC (ref 0.0–0.0)
Platelets: 251 10*3/uL (ref 142–346)
RBC: 3.68 10*6/uL — ABNORMAL LOW (ref 3.90–5.10)
RDW: 13 % (ref 11–15)
WBC: 6.27 10*3/uL (ref 3.10–9.50)

## 2021-03-19 LAB — CELL MORPHOLOGY
Cell Morphology: ABNORMAL — AB
Platelet Estimate: NORMAL

## 2021-03-19 LAB — GFR: EGFR: >60

## 2021-03-19 LAB — LIPID PANEL
Cholesterol / HDL Ratio: 1.9
Cholesterol: 168 mg/dL (ref 0–199)
HDL: 89 mg/dL (ref 40–9999)
LDL Calculated: 63 mg/dL (ref 0–99)
Triglycerides: 79 mg/dL (ref 34–149)
VLDL Calculated: 16 mg/dL (ref 10–40)

## 2021-03-19 LAB — COMPREHENSIVE METABOLIC PANEL
ALT: 153 U/L — ABNORMAL HIGH (ref 0–55)
AST (SGOT): 204 U/L — ABNORMAL HIGH (ref 5–34)
Albumin/Globulin Ratio: 1.2 (ref 0.9–2.2)
Albumin: 4.6 g/dL (ref 3.5–5.0)
Alkaline Phosphatase: 63 U/L (ref 37–117)
Anion Gap: 13 (ref 5.0–15.0)
BUN: 5 mg/dL — ABNORMAL LOW (ref 7.0–19.0)
Bilirubin, Total: 0.7 mg/dL (ref 0.2–1.2)
CO2: 20 mEq/L — ABNORMAL LOW (ref 21–29)
Calcium: 9.7 mg/dL (ref 8.5–10.5)
Chloride: 104 mEq/L (ref 100–111)
Creatinine: 0.8 mg/dL (ref 0.4–1.5)
Globulin: 3.7 g/dL — ABNORMAL HIGH (ref 2.0–3.6)
Glucose: 67 mg/dL — ABNORMAL LOW (ref 70–100)
Potassium: 4.7 mEq/L (ref 3.5–5.1)
Protein, Total: 8.3 g/dL (ref 6.0–8.3)
Sodium: 137 mEq/L (ref 136–145)

## 2021-03-19 LAB — HEMOGLOBIN A1C
Average Estimated Glucose: 85.3 mg/dL
Hemoglobin A1C: 4.6 % (ref 4.6–5.9)

## 2021-03-19 LAB — TSH: TSH: 1.08 u[IU]/mL (ref 0.35–4.94)

## 2021-03-19 LAB — HEMOLYSIS INDEX: Hemolysis Index: 7 (ref 0–24)

## 2021-03-19 MED ORDER — ERYTHROMYCIN 5 MG/GM OP OINT
TOPICAL_OINTMENT | Freq: Three times a day (TID) | OPHTHALMIC | 0 refills | Status: AC
Start: 2021-03-19 — End: 2021-03-29

## 2021-03-19 NOTE — Progress Notes (Signed)
Have you seen any specialists/other providers since your last visit with us?    No    Do you agree to telemedicine visit?  No    Arm preference verified?   Yes, no preference    There are no preventive care reminders to display for this patient.

## 2021-03-19 NOTE — Progress Notes (Signed)
Subjective:      Date: 03/19/2021 10:14 AM   Patient ID: Alexandra Schroeder is a 35 y.o. female.    Chief Complaint:  Chief Complaint   Patient presents with    Annual Exam     fasting       HPI: C/o fibrocystic breasts. Has had a biopsy on either breast over the past 2-3 years. Results were benign. Noticing new lumps on either breast. Requesting referral for mammogram.  Recurrent styes on either upper eye lid for past few months. She has seasonal allergies. Rubs her eyes frequently.     Visit Type: Health Maintenance Visit  Work Status: not currently employed  Reported Health: good health  Diet: compliant with well-balanced diet  Exercise: regularly  Dental: dentist visit within 6-12 months  Vision: glasses  Hearing: normal hearing  Immunization Status: Tdap vaccination due Covid booster recommended.  Reproductive Health: not currently sexually active  Prior Screening Tests: last pap smear in 2019 wnl. HPV positive.   General Health Risks: Unknown family history-adopted.  Safety Elements Used: uses seat belts    Problem List:  There is no problem list on file for this patient.      Current Medications:  Outpatient Medications Marked as Taking for the 03/19/21 encounter (Office Visit) with Armandina Stammer, MD   Medication Sig Dispense Refill    Etonogestrel (NEXPLANON SC)            Allergies:  Allergies   Allergen Reactions    Amoxicillin Anaphylaxis and Swelling    Clonidine Other (See Comments)     Restless body    Penicillins Anaphylaxis and Swelling    Septra [Sulfamethoxazole-Trimethoprim] Anaphylaxis and Swelling    Sulfa Antibiotics Anaphylaxis and Swelling       Past Medical History:  Past Medical History:   Diagnosis Date    Anemia     History of chlamydia     age 60    Hypertension     Lump, breast     left,       Past Surgical History:  Past Surgical History:   Procedure Laterality Date    TONSILLECTOMY         Family History:  Family History   Adopted: Yes   Family history unknown: Yes        Social History:  Social History     Tobacco Use    Smoking status: Current Every Day Smoker     Packs/day: 1.00    Smokeless tobacco: Never Used   Substance Use Topics    Alcohol use: Yes     Alcohol/week: 15.0 standard drinks     Types: 15 Glasses of wine per week    Drug use: No          The following sections were reviewed this encounter by the provider:   Tobacco   Allergies   Meds   Problems   Med Hx   Surg Hx   Fam Hx            Vitals:  BP 136/86    Pulse (!) 109    Temp 97.5 F (36.4 C)    Ht 1.64 m (5' 4.57")    Wt 53.9 kg (118 lb 12.8 oz)    SpO2 98%    BMI 20.04 kg/m           ROS:   Review of Systems   Constitutional: Negative for fatigue and fever.   HENT: Negative for sore throat.  Eyes: Negative for visual disturbance.        Recurrent styes on either eye as per HPI.   Respiratory: Negative for cough and shortness of breath.    Cardiovascular: Negative for chest pain and palpitations.   Gastrointestinal: Negative for constipation, diarrhea, nausea and vomiting.   Genitourinary: Negative for difficulty urinating.   Musculoskeletal: Negative for arthralgias.   Neurological: Negative for dizziness.   Psychiatric/Behavioral: Negative for behavioral problems.        Exam:   Physical Exam  Vitals reviewed.   Constitutional:       Appearance: Normal appearance.   HENT:      Right Ear: Tympanic membrane normal.      Left Ear: Tympanic membrane normal.      Mouth/Throat:      Pharynx: No posterior oropharyngeal erythema.   Eyes:      Extraocular Movements: Extraocular movements intact.      Conjunctiva/sclera: Conjunctivae normal.      Pupils: Pupils are equal, round, and reactive to light.      Comments: No stye noted on either eyelid.   Cardiovascular:      Rate and Rhythm: Normal rate and regular rhythm.      Heart sounds: Normal heart sounds.   Pulmonary:      Effort: Pulmonary effort is normal.      Breath sounds: Normal breath sounds.   Abdominal:      Palpations: Abdomen is soft.       Tenderness: There is no abdominal tenderness. There is no guarding.   Musculoskeletal:         General: Normal range of motion.   Lymphadenopathy:      Cervical: No cervical adenopathy.   Neurological:      General: No focal deficit present.      Mental Status: She is alert and oriented to person, place, and time.   Psychiatric:         Mood and Affect: Mood normal.         Behavior: Behavior normal.         Assessment:       1. Routine general medical examination at a health care facility  - CBC and differential  - Comprehensive metabolic panel  - Lipid panel  - Hemoglobin A1C  - TSH    2. Need for vaccination  - Tdap vaccine greater than or equal to 7yo IM; Future    3. Mass of breast, unspecified laterality  - Mammo Digital Diagnostic Bilateral W Cad; Future    4. Hordeolum externum, unspecified laterality  - erythromycin (ROMYCIN) ophthalmic ointment; Place into both eyes 3 (three) times daily for 10 days  Dispense: 3.5 g; Refill: 0      Plan:   Health Maintenance:   Recommend optimizing low carbohydrate diet efforts and obtaining at least 150 minutes of aerobic exercise per week. Recommend 20-25 grams of dietary fiber daily. Recommend drinking at least 60-80 ounces of water per day.Gynecology surveillance is due.  Recommend follow-up with gynecology at earliest convenience. 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  Tdap and Covid booster recommended. Labs ordered.    Fibrocystic breasts: Referral provided for diagnostic mammogram with ultrasound as indicated.     Styes: Hand hygiene discussed. Avoid rubbing eyes. Topical antibiotic prescribed for prn use.     Follow-up:   As needed   Armandina Stammer, MD

## 2021-03-20 ENCOUNTER — Other Ambulatory Visit (INDEPENDENT_AMBULATORY_CARE_PROVIDER_SITE_OTHER): Payer: Self-pay | Admitting: Family Medicine

## 2021-03-20 DIAGNOSIS — K7010 Alcoholic hepatitis without ascites: Secondary | ICD-10-CM

## 2021-07-01 ENCOUNTER — Ambulatory Visit: Payer: No Typology Code available for payment source

## 2021-12-01 ENCOUNTER — Other Ambulatory Visit (INDEPENDENT_AMBULATORY_CARE_PROVIDER_SITE_OTHER): Payer: Self-pay | Admitting: Family Medicine

## 2021-12-01 DIAGNOSIS — N63 Unspecified lump in unspecified breast: Secondary | ICD-10-CM

## 2021-12-29 ENCOUNTER — Other Ambulatory Visit (INDEPENDENT_AMBULATORY_CARE_PROVIDER_SITE_OTHER): Payer: Self-pay | Admitting: Family Medicine

## 2021-12-29 ENCOUNTER — Ambulatory Visit: Payer: BC Managed Care – PPO

## 2021-12-29 ENCOUNTER — Ambulatory Visit: Payer: BC Managed Care – PPO | Attending: Family Medicine

## 2021-12-29 ENCOUNTER — Ambulatory Visit
Admission: RE | Admit: 2021-12-29 | Discharge: 2021-12-29 | Disposition: A | Discharge status: Home / Self Care | Payer: Self-pay | Source: Ambulatory Visit | Attending: Family Medicine | Admitting: Family Medicine

## 2021-12-29 DIAGNOSIS — N63 Unspecified lump in unspecified breast: Secondary | ICD-10-CM

## 2021-12-29 DIAGNOSIS — Q859 Phakomatosis, unspecified: Secondary | ICD-10-CM | POA: Insufficient documentation

## 2021-12-29 DIAGNOSIS — N6314 Unspecified lump in the right breast, lower inner quadrant: Secondary | ICD-10-CM | POA: Insufficient documentation

## 2021-12-29 DIAGNOSIS — D241 Benign neoplasm of right breast: Secondary | ICD-10-CM | POA: Insufficient documentation

## 2021-12-29 DIAGNOSIS — N6321 Unspecified lump in the left breast, upper outer quadrant: Secondary | ICD-10-CM | POA: Insufficient documentation

## 2024-07-20 ENCOUNTER — Other Ambulatory Visit (INDEPENDENT_AMBULATORY_CARE_PROVIDER_SITE_OTHER): Payer: Self-pay | Admitting: Family Medicine
